# Patient Record
Sex: Male | Born: 1975
Health system: Southern US, Community
[De-identification: ages and names within clinical notes are randomized; demographics above are authoritative.]

## PROBLEM LIST (undated history)

## (undated) DIAGNOSIS — S8001XA Contusion of right knee, initial encounter: Secondary | ICD-10-CM

## (undated) DIAGNOSIS — G4733 Obstructive sleep apnea (adult) (pediatric): Secondary | ICD-10-CM

## (undated) DIAGNOSIS — J069 Acute upper respiratory infection, unspecified: Secondary | ICD-10-CM

## (undated) DIAGNOSIS — J302 Other seasonal allergic rhinitis: Secondary | ICD-10-CM

## (undated) DIAGNOSIS — E291 Testicular hypofunction: Secondary | ICD-10-CM

## (undated) DIAGNOSIS — K625 Hemorrhage of anus and rectum: Secondary | ICD-10-CM

## (undated) DIAGNOSIS — H9209 Otalgia, unspecified ear: Secondary | ICD-10-CM

## (undated) HISTORY — DX: Obstructive sleep apnea (adult) (pediatric): G47.33

## (undated) HISTORY — DX: Other seasonal allergic rhinitis: J30.2

## (undated) HISTORY — PX: TYMPANOSTOMY TUBE PLACEMENT: SHX32

## (undated) HISTORY — PX: TONSILLECTOMY: SHX5217

## (undated) HISTORY — DX: Testicular hypofunction: E29.1

## (undated) HISTORY — DX: Hemorrhage of anus and rectum: K62.5

## (undated) HISTORY — DX: Otalgia, unspecified ear: H92.09

## (undated) HISTORY — DX: Contusion of right knee, initial encounter: S80.01XA

## (undated) HISTORY — PX: OTHER SURGICAL HISTORY: SHX169

## (undated) HISTORY — DX: Acute upper respiratory infection, unspecified: J06.9

---

## 1998-03-23 ENCOUNTER — Emergency Department (HOSPITAL_COMMUNITY): Admission: EM | Admit: 1998-03-23 | Discharge: 1998-03-24 | Payer: Self-pay

## 1998-12-09 ENCOUNTER — Ambulatory Visit (HOSPITAL_COMMUNITY): Admission: RE | Admit: 1998-12-09 | Discharge: 1998-12-09 | Payer: Self-pay | Admitting: Family Medicine

## 1998-12-09 ENCOUNTER — Encounter: Payer: Self-pay | Admitting: Family Medicine

## 2013-07-23 ENCOUNTER — Other Ambulatory Visit: Payer: Self-pay | Admitting: Family Medicine

## 2013-07-23 ENCOUNTER — Ambulatory Visit
Admission: RE | Admit: 2013-07-23 | Discharge: 2013-07-23 | Disposition: A | Discharge status: Home / Self Care | Payer: 59 | Source: Ambulatory Visit | Attending: Family Medicine | Admitting: Family Medicine

## 2013-07-23 DIAGNOSIS — M25561 Pain in right knee: Secondary | ICD-10-CM

## 2013-07-23 DIAGNOSIS — R609 Edema, unspecified: Secondary | ICD-10-CM

## 2013-07-23 DIAGNOSIS — W19XXXA Unspecified fall, initial encounter: Secondary | ICD-10-CM

## 2015-02-14 IMAGING — CR DG KNEE AP/LAT W/ SUNRISE*R*
3 series · 3 of 3 positions shown · non-contrast
Comparison: None.

CLINICAL DATA: Fell onto anterior knee this morning

EXAM:
DG KNEE - 3 VIEWS

[view not recorded (1 of 3)]
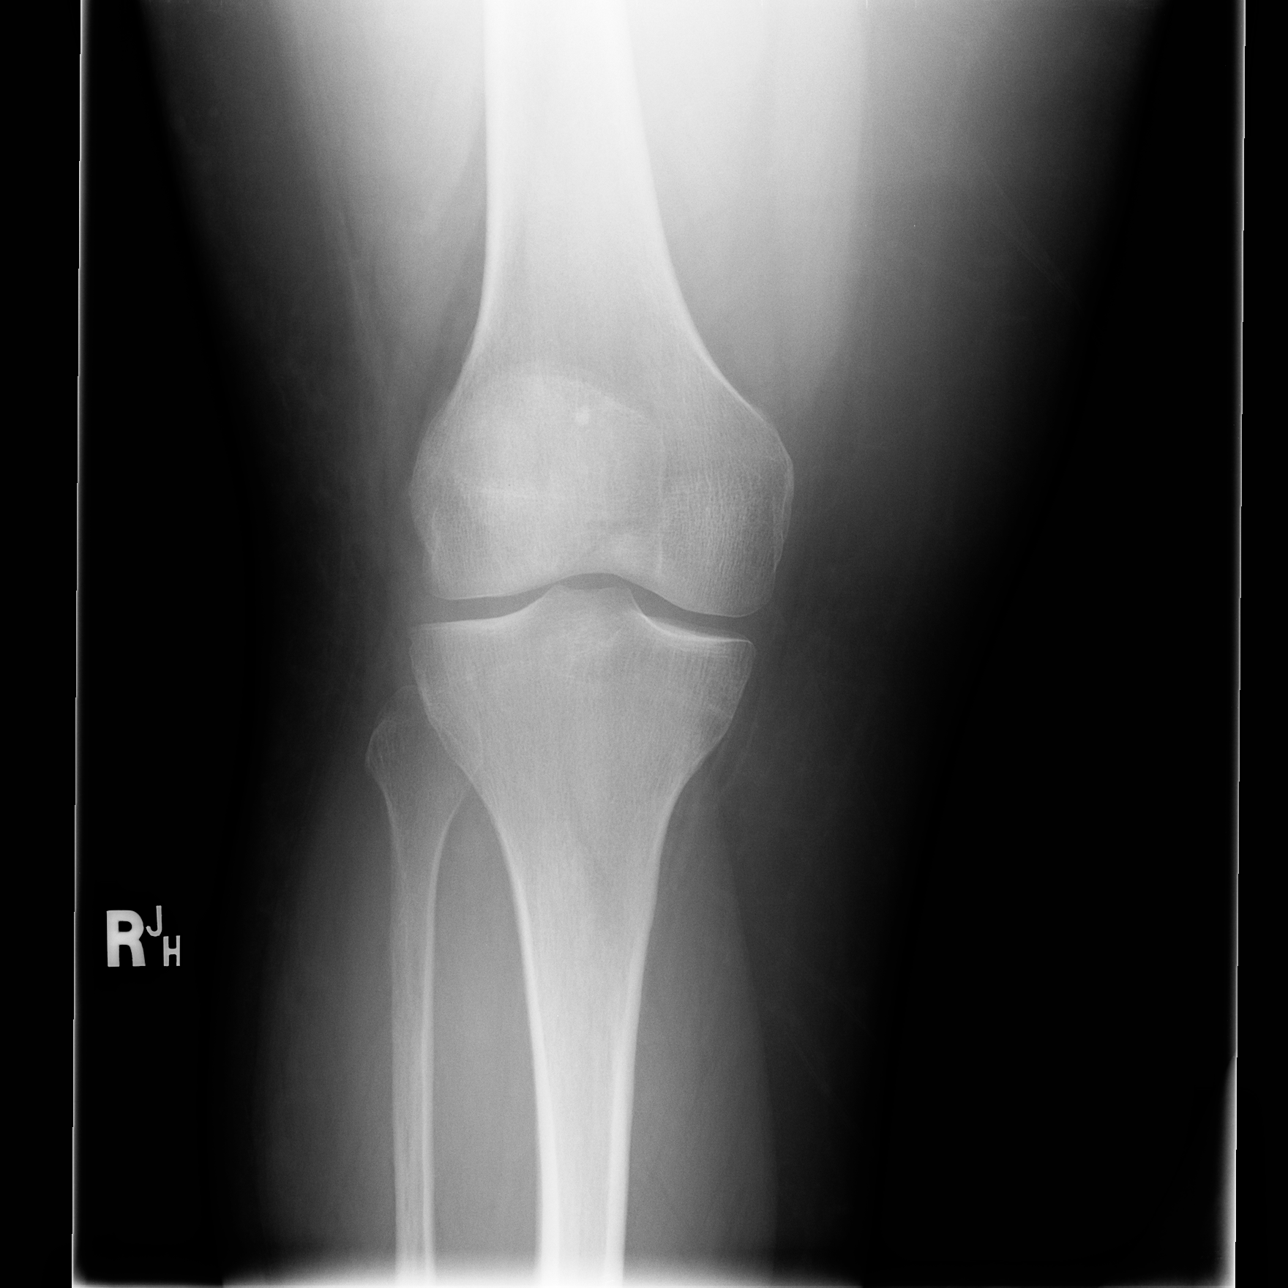

[view not recorded (2 of 3)]
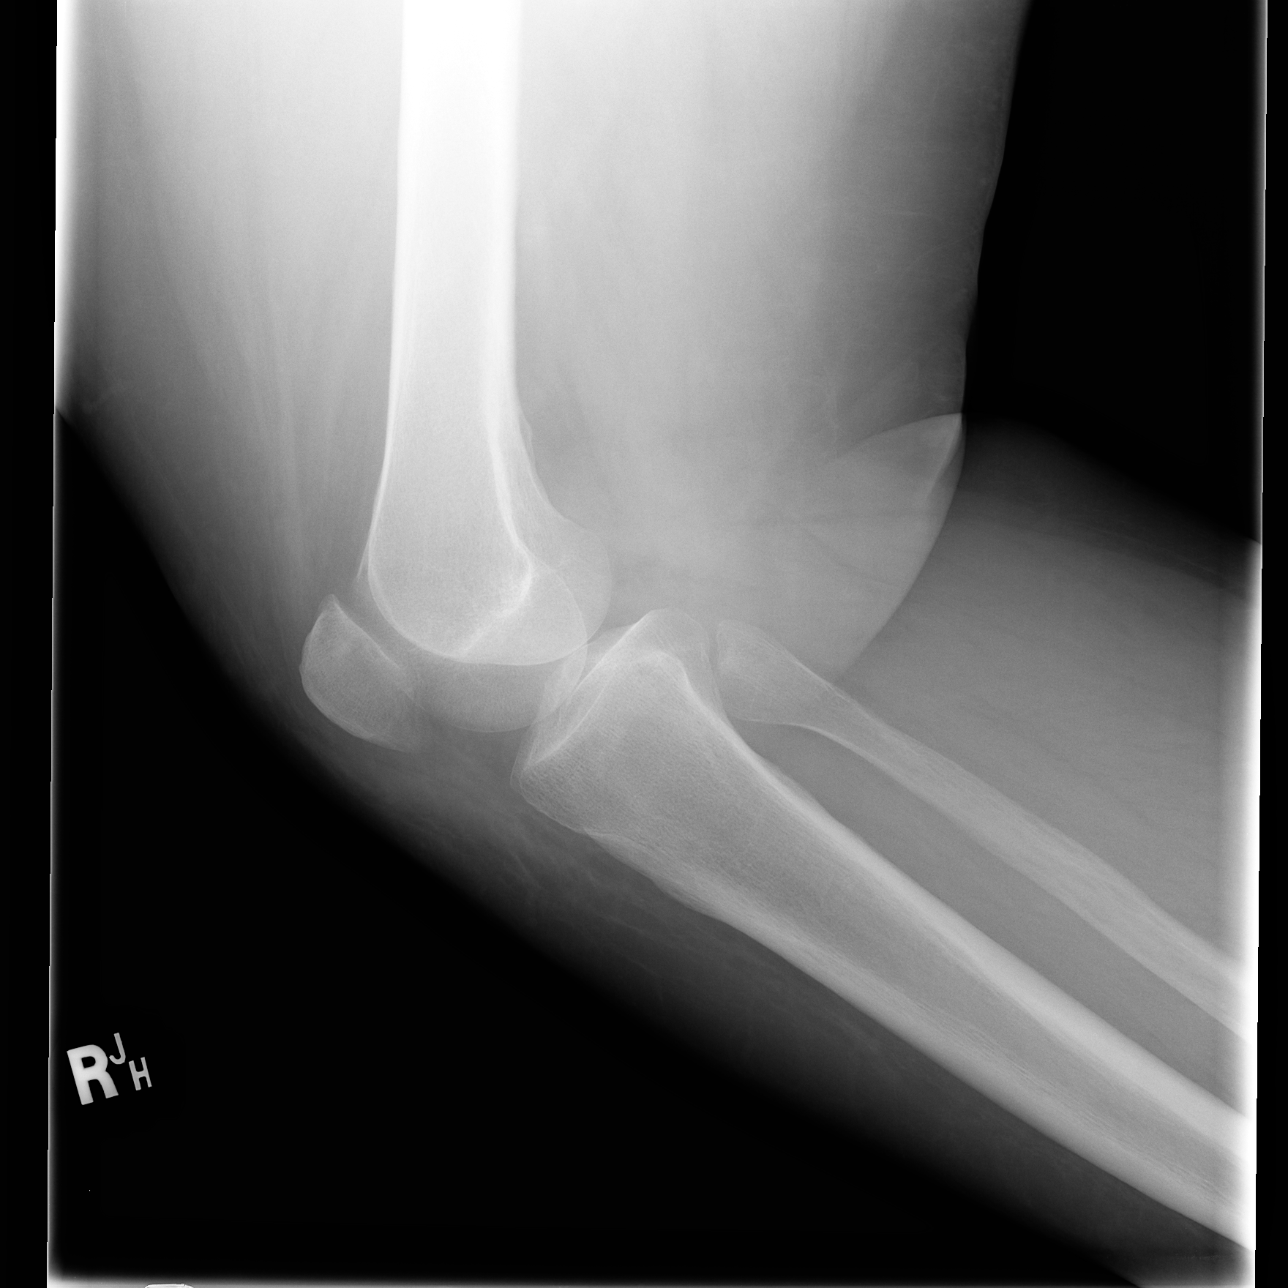

[view not recorded (3 of 3)]
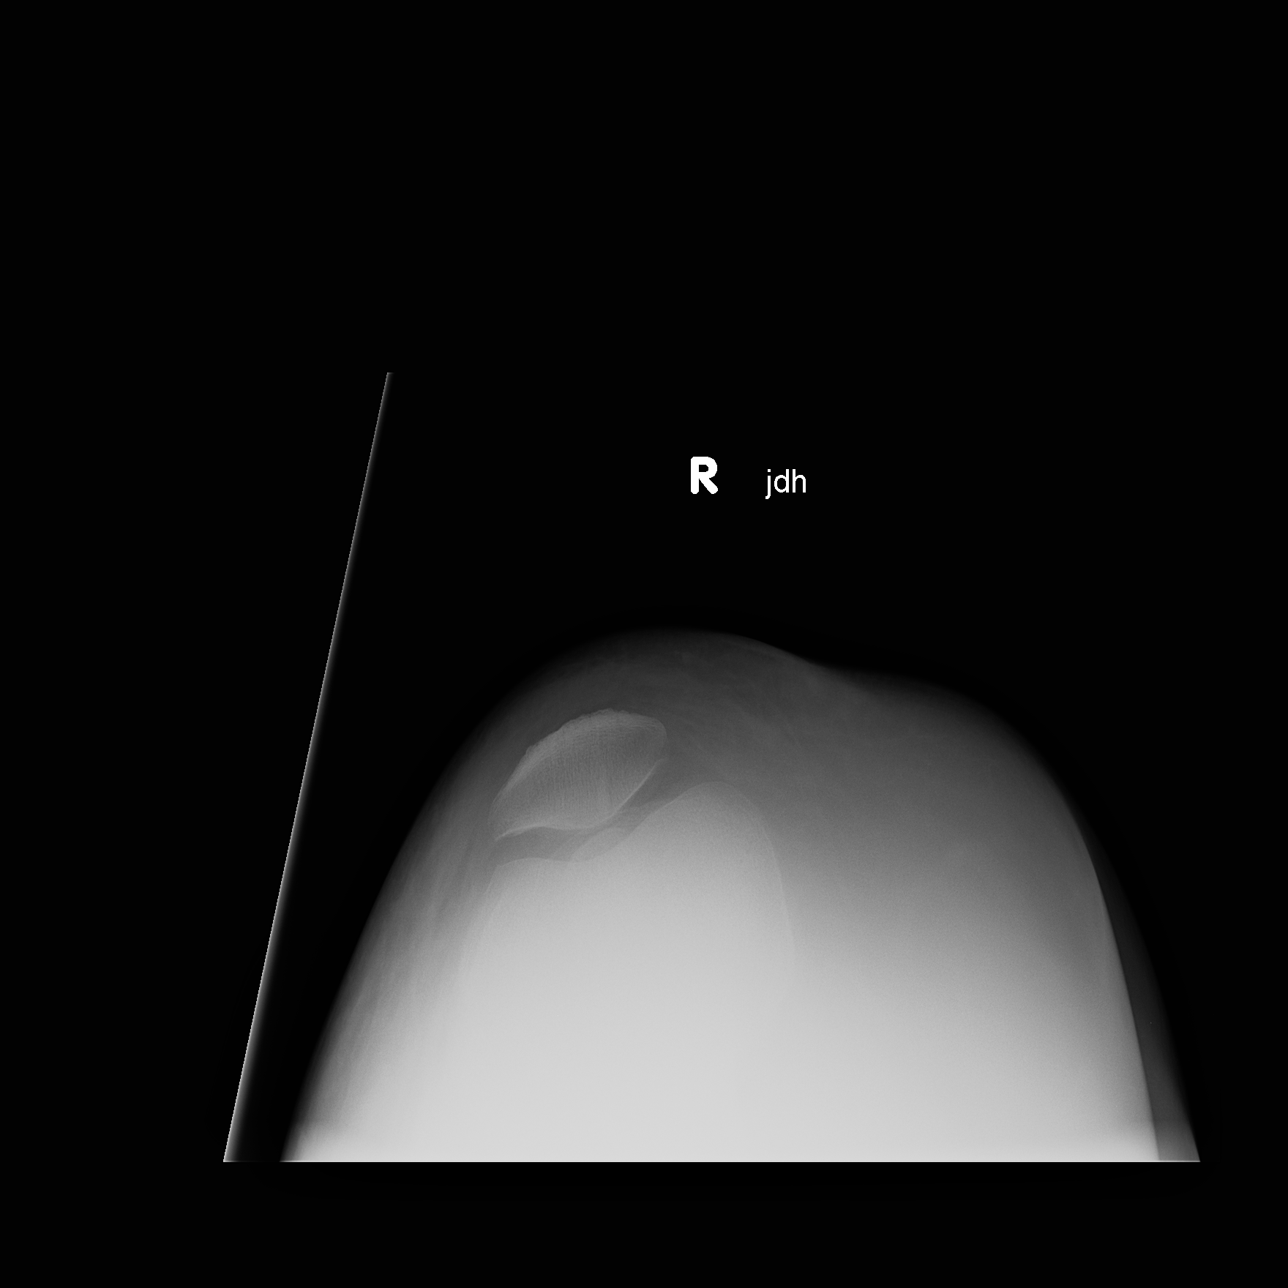

[3 of 3 positions shown; findings below may reference images not displayed]

FINDINGS: No fracture, dislocation, or joint effusion.
IMPRESSION: Negative.

## 2017-02-27 ENCOUNTER — Ambulatory Visit
Admission: RE | Admit: 2017-02-27 | Discharge: 2017-02-27 | Disposition: A | Discharge status: Home / Self Care | Payer: Managed Care, Other (non HMO) | Source: Ambulatory Visit | Attending: Family Medicine | Admitting: Family Medicine

## 2017-02-27 ENCOUNTER — Other Ambulatory Visit: Payer: Self-pay | Admitting: Family Medicine

## 2017-02-27 DIAGNOSIS — M25511 Pain in right shoulder: Secondary | ICD-10-CM

## 2018-09-21 IMAGING — DX DG HUMERUS 2V *R*
2 series · 2 of 2 positions shown · non-contrast
Comparison: None.

CLINICAL DATA: Right shoulder and humerus pain for 2-3 weeks, no
known injury, prior humeral fracture

EXAM:
RIGHT HUMERUS - 2+ VIEW

[dg humerus right (1 of 2)]
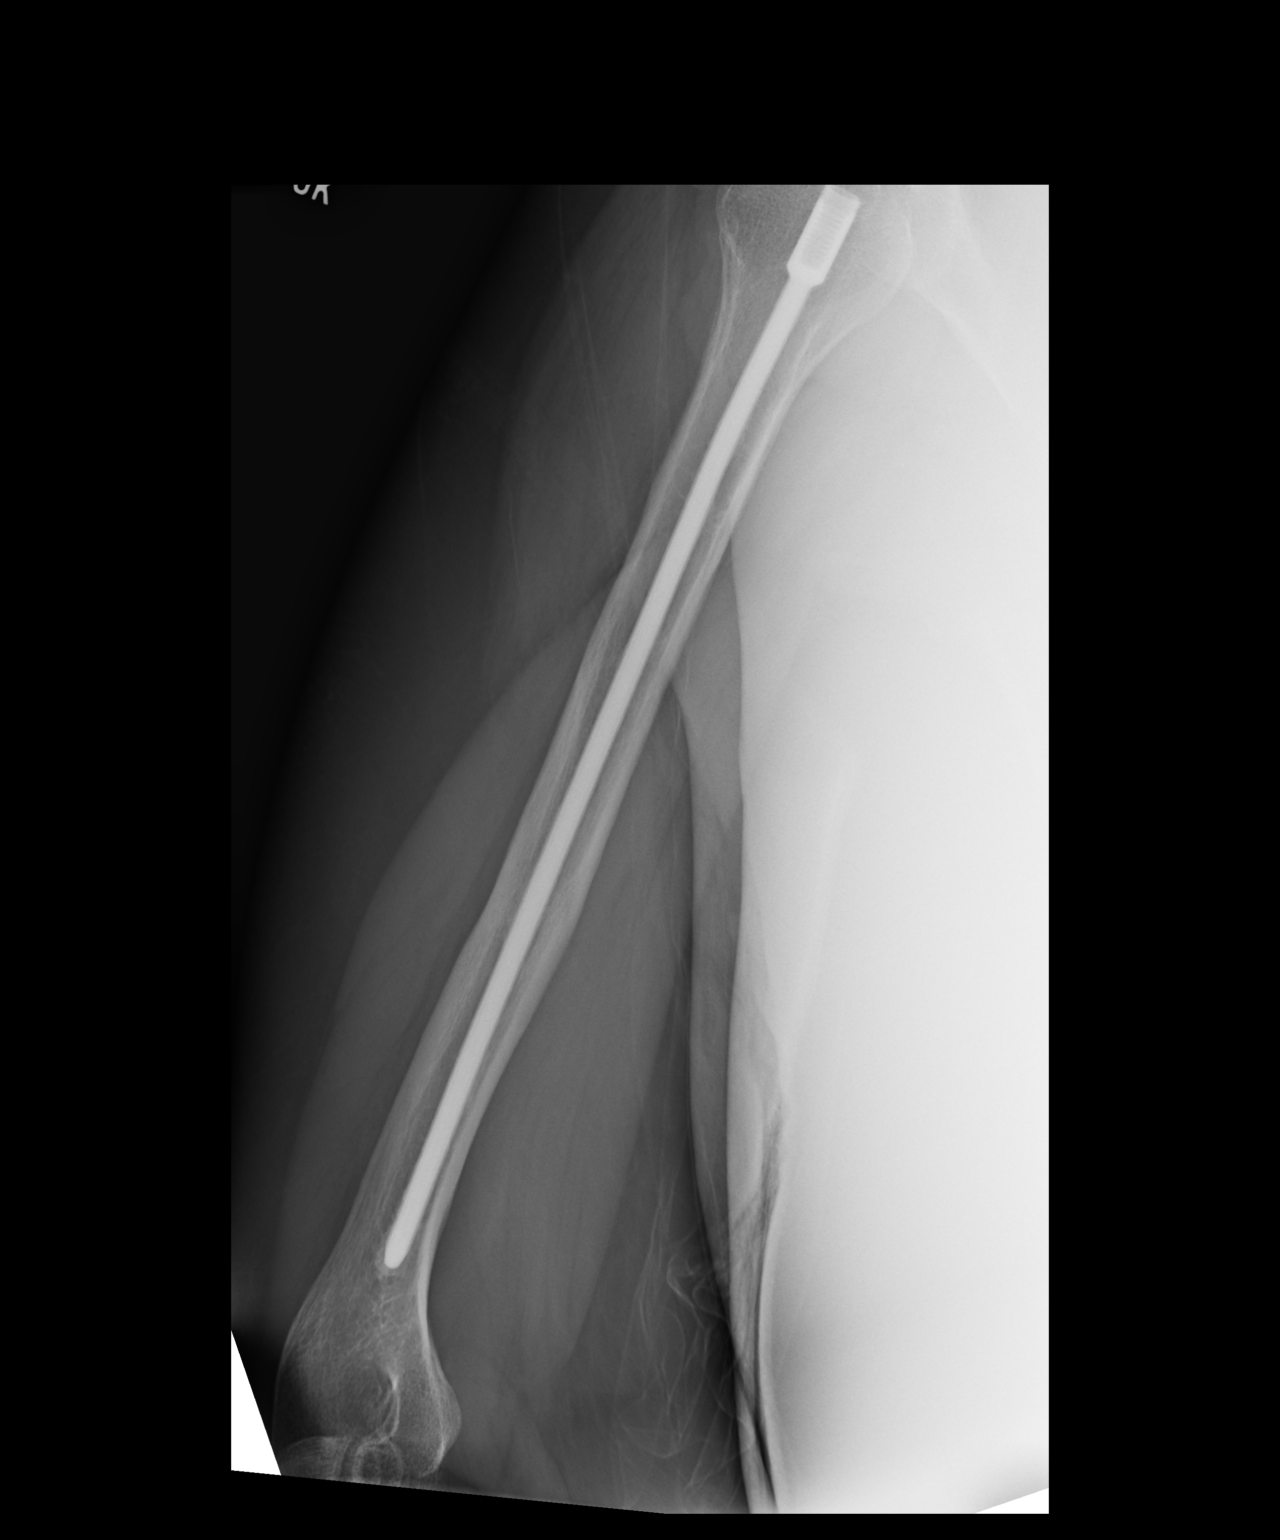

[dg humerus right (2 of 2)]
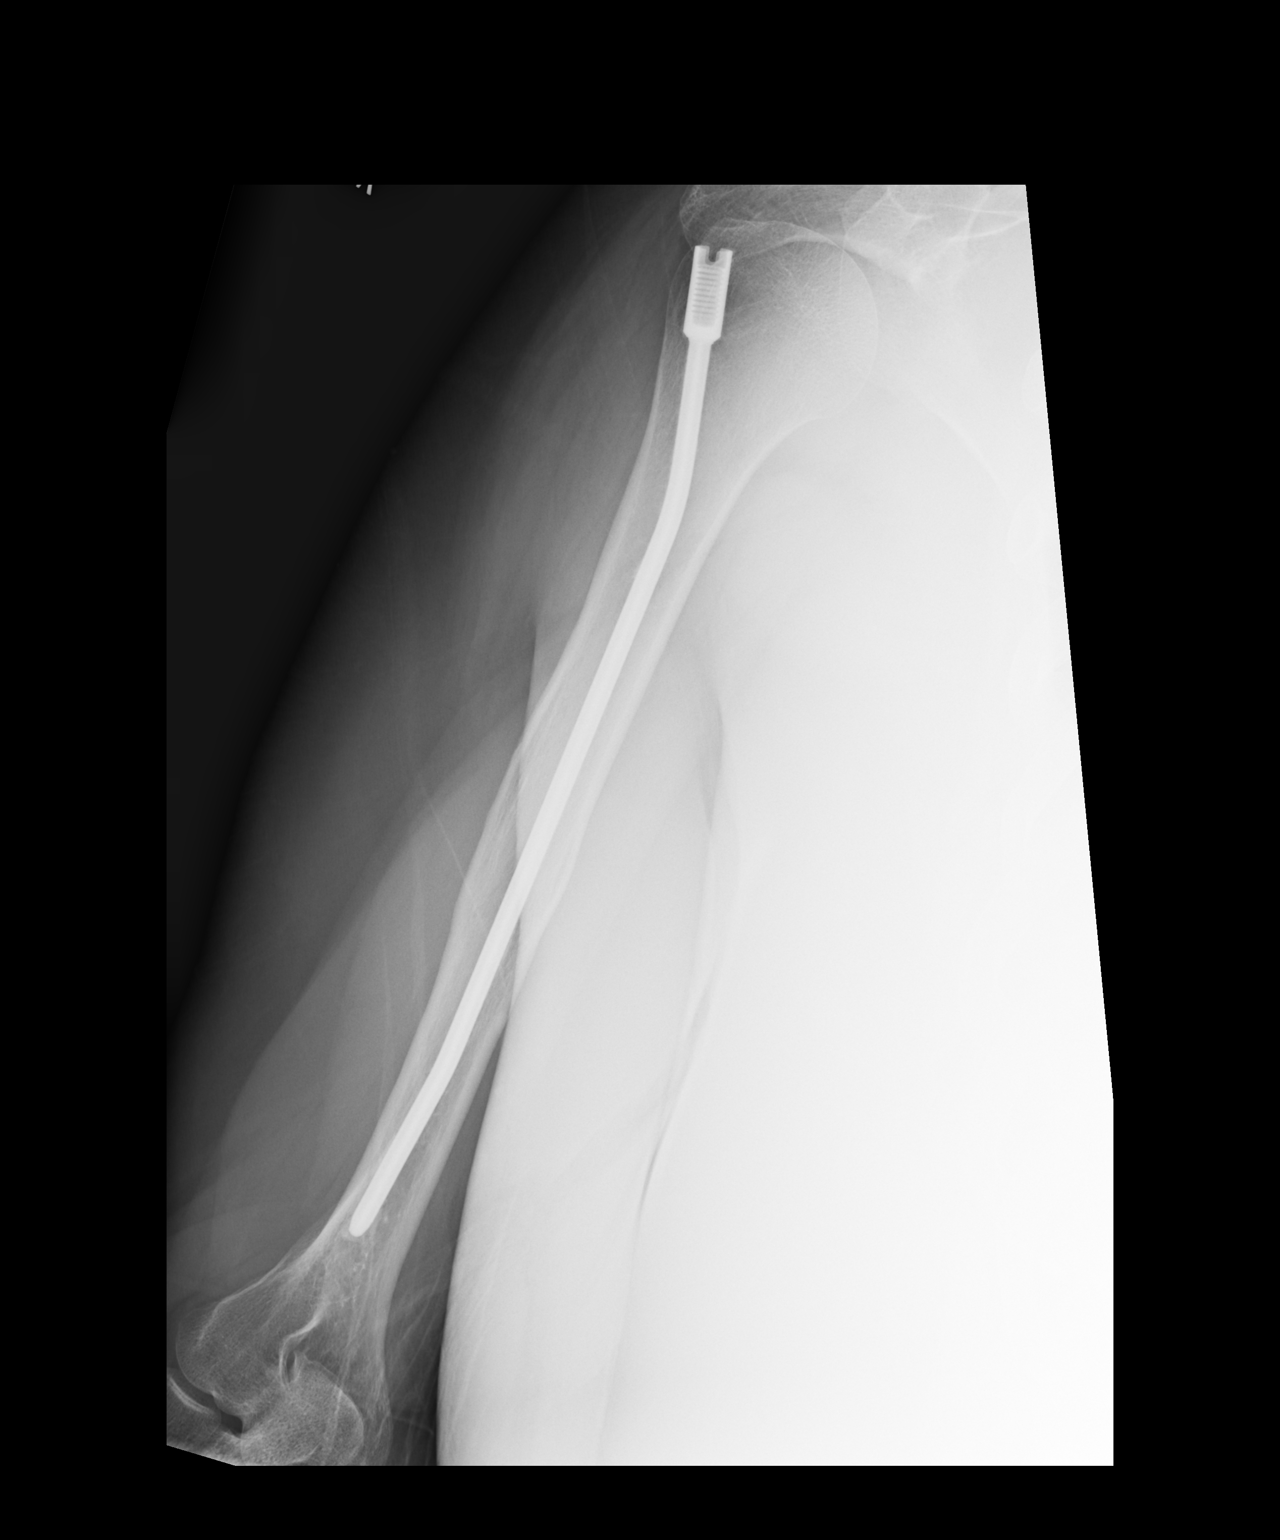

[2 of 2 positions shown; findings below may reference images not displayed]

FINDINGS: The left humerus appears intact. intramedullary pin is noted from
prior fracture with healing.
IMPRESSION: Negative.

## 2019-10-02 ENCOUNTER — Ambulatory Visit: Payer: Managed Care, Other (non HMO) | Attending: Internal Medicine

## 2019-10-02 DIAGNOSIS — Z20822 Contact with and (suspected) exposure to covid-19: Secondary | ICD-10-CM

## 2019-10-03 LAB — NOVEL CORONAVIRUS, NAA: SARS-CoV-2, NAA: NOT DETECTED

## 2019-11-27 ENCOUNTER — Ambulatory Visit: Payer: Managed Care, Other (non HMO) | Attending: Internal Medicine

## 2019-11-27 DIAGNOSIS — Z23 Encounter for immunization: Secondary | ICD-10-CM

## 2019-11-27 NOTE — Progress Notes (Signed)
   Covid-[facility] Vaccination [facility]  Name:  ENOS MUHL    MRN: [id number] DOB: 08/16/1976  11/27/2019  Mr. Duley was observed post Covid-19 immunization for 15 minutes without incident. He was provided with Vaccine Information Sheet and instruction to access the V-Safe system.   Mr. Santillana was instructed to call 911 with any severe reactions post vaccine: Marland Kitchen Difficulty breathing  . Swelling of face and throat  . A fast heartbeat  . A bad rash all over body  . Dizziness and weakness   Immunizations Administered    Name Date Dose VIS Date Route   Pfizer COVID-19 Vaccine 11/27/2019  4:42 PM 0.3 mL 08/15/2019 Intramuscular   Manufacturer: [employer], [employer]   Lot: [id number]   NDC: [address]-2

## 2019-12-23 ENCOUNTER — Ambulatory Visit: Payer: Managed Care, Other (non HMO) | Attending: Internal Medicine

## 2019-12-23 DIAGNOSIS — Z23 Encounter for immunization: Secondary | ICD-10-CM

## 2019-12-23 NOTE — Progress Notes (Signed)
   Covid-19 Vaccination Clinic  Name:  Jeffrey Ford    MRN: [id number] DOB: Dec 06, 1975  12/23/2019  [name] was observed post Covid-19 immunization for 15 minutes without incident. He was provided with Vaccine Information Sheet and instruction to access the V-Safe system.   [name] was instructed to call 911 with any severe reactions post vaccine: Marland Kitchen Difficulty breathing  . Swelling of face and throat  . A fast heartbeat  . A bad rash all over body  . Dizziness and weakness   Immunizations Administered    Name Date Dose VIS Date Route   Pfizer COVID-19 Vaccine 12/23/2019  4:44 PM 0.3 mL 10/29/2018 Intramuscular   Manufacturer: [employer], [employer]   Lot: [id number]   NDC: [address]-2
